# Patient Record
Sex: Male | Born: 1985 | Race: White | Hispanic: No | Marital: Single | Smoking: Current some day smoker
Health system: Southern US, Community
[De-identification: ages and names within clinical notes are randomized; demographics above are authoritative.]

## PROBLEM LIST (undated history)

## (undated) DIAGNOSIS — F419 Anxiety disorder, unspecified: Secondary | ICD-10-CM

---
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
## 2007-06-02 ENCOUNTER — Ambulatory Visit: Payer: Self-pay | Admitting: Family Medicine

## 2008-02-08 IMAGING — CR DG CHEST 2V
2 series · 2 of 2 positions shown · non-contrast
Comparison: None

CLINICAL DATA: Difficulty breathing

CHEST - 2 VIEW:

[view not recorded (1 of 2)]
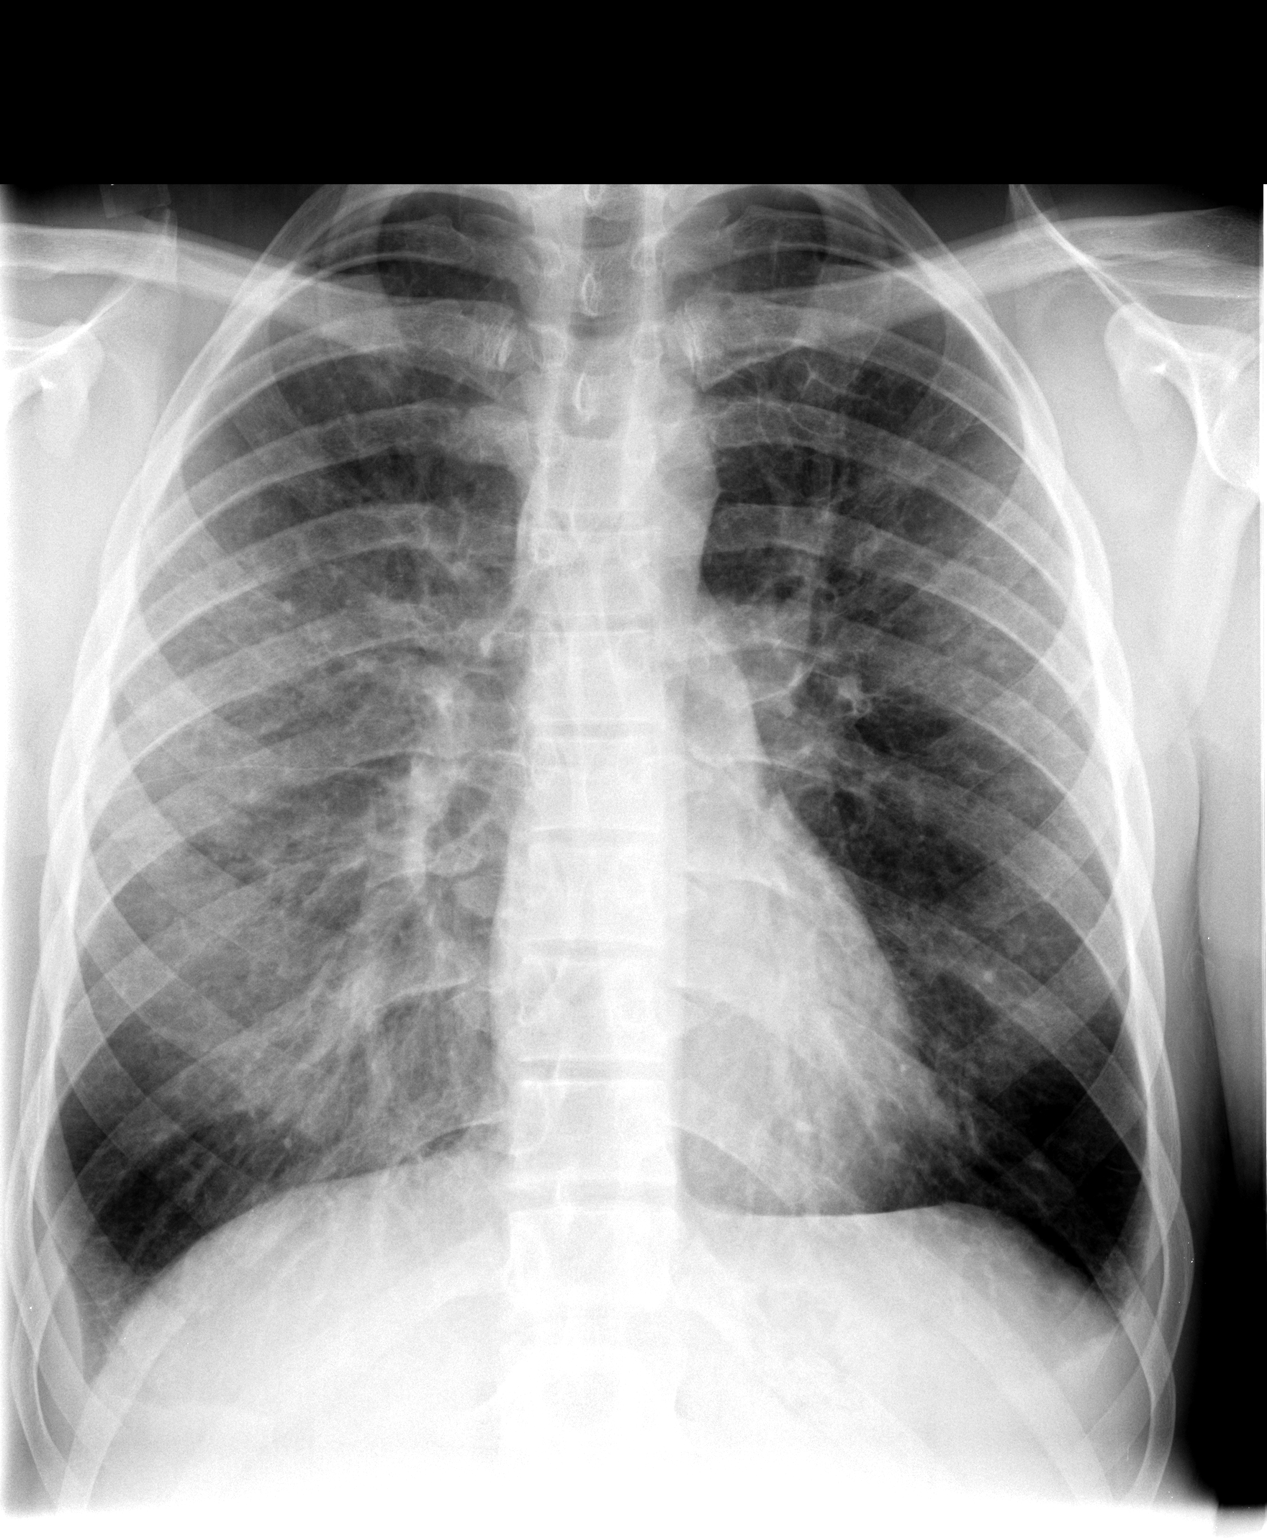

[view not recorded (2 of 2)]
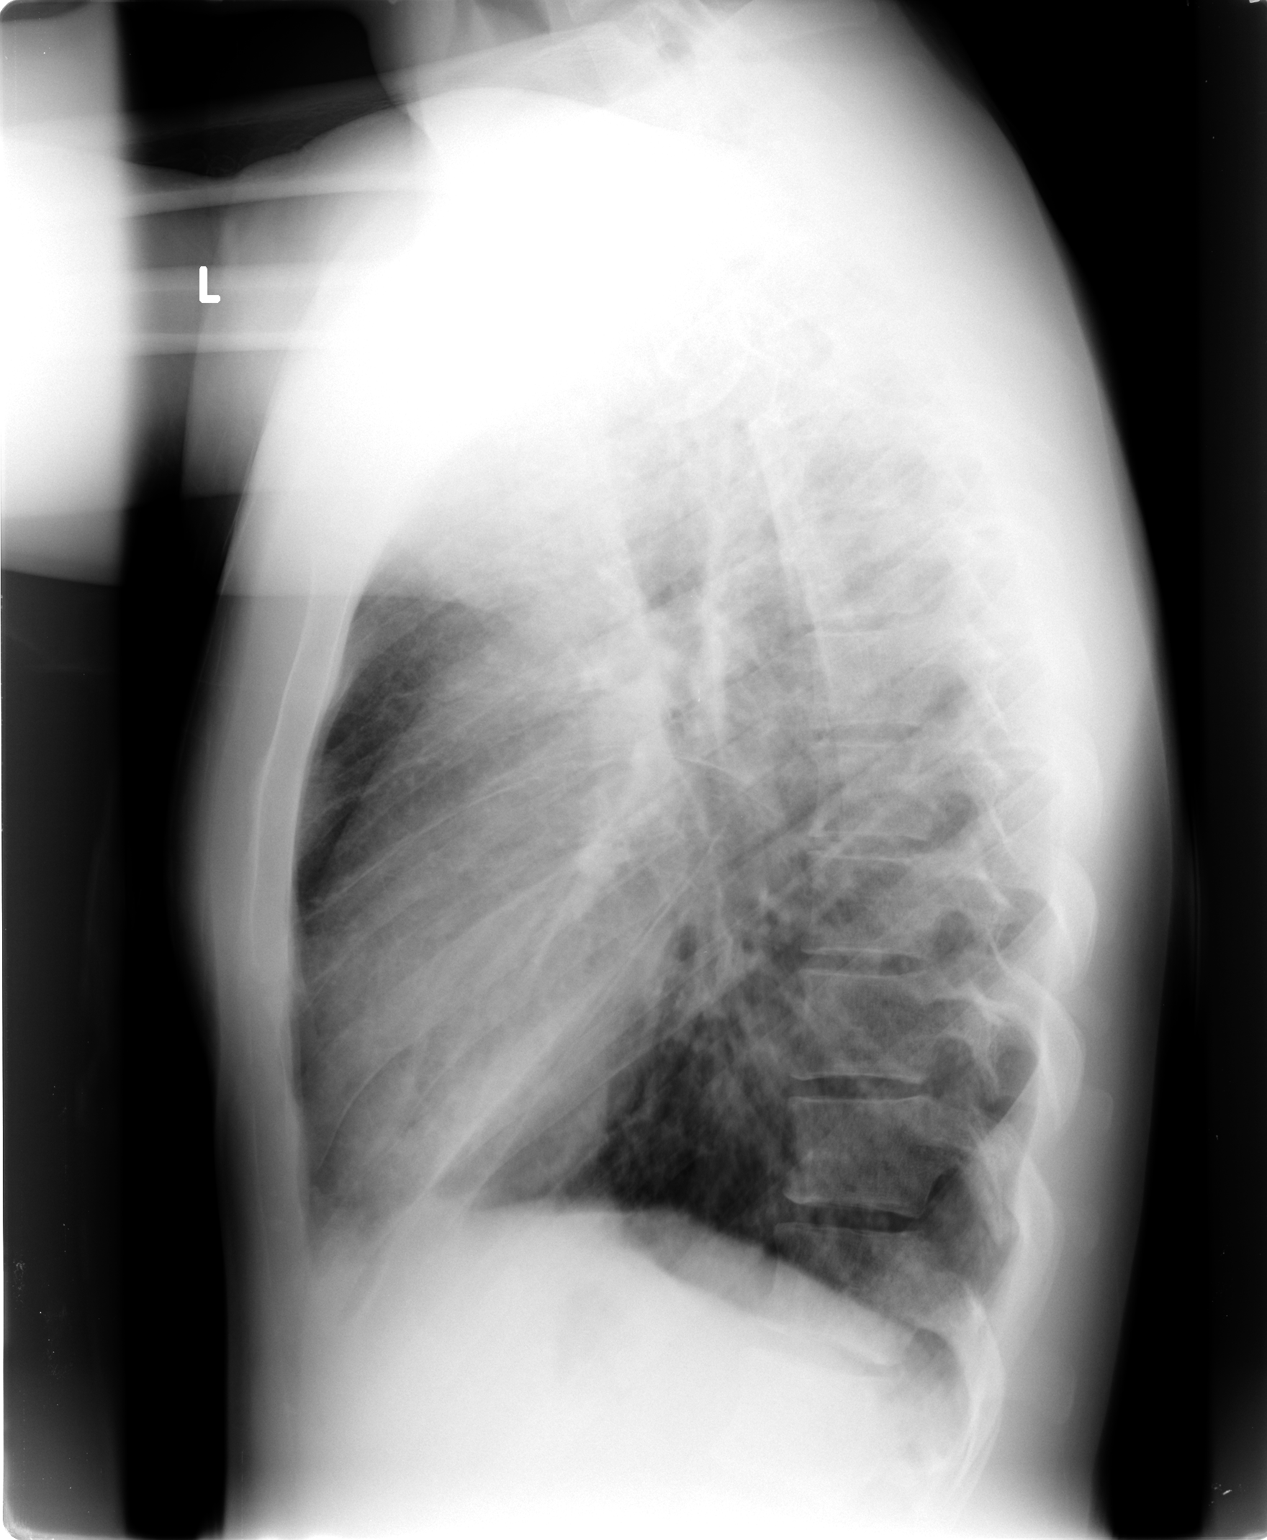

[2 of 2 positions shown; findings below may reference images not displayed]

FINDINGS: Heart and mediastinal contours are within normal limits. There is
peribronchial thickening and hyperinflation of the lungs. No focal opacities.
Small bilateral effusions noted. Visualized skeleton unremarkable.
IMPRESSION: Mild bronchitic changes with hyperinflation. Small bilateral effusions.

## 2010-04-28 ENCOUNTER — Emergency Department (HOSPITAL_COMMUNITY)
Admission: EM | Admit: 2010-04-28 | Discharge: 2010-04-28 | Disposition: A | Discharge status: Home / Self Care | Payer: BLUE CROSS/BLUE SHIELD | Attending: Emergency Medicine | Admitting: Emergency Medicine

## 2010-04-28 DIAGNOSIS — T50901A Poisoning by unspecified drugs, medicaments and biological substances, accidental (unintentional), initial encounter: Secondary | ICD-10-CM | POA: Insufficient documentation

## 2010-04-28 DIAGNOSIS — Y9229 Other specified public building as the place of occurrence of the external cause: Secondary | ICD-10-CM | POA: Insufficient documentation

## 2010-04-28 DIAGNOSIS — R000 Tachycardia, unspecified: Secondary | ICD-10-CM | POA: Insufficient documentation

## 2010-04-28 LAB — RAPID URINE DRUG SCREEN, HOSP PERFORMED
Amphetamines: NOT DETECTED
Barbiturates: NOT DETECTED
Benzodiazepines: NOT DETECTED
Cocaine: NOT DETECTED
Opiates: NOT DETECTED

## 2010-09-24 ENCOUNTER — Emergency Department (HOSPITAL_COMMUNITY)
Admission: EM | Admit: 2010-09-24 | Discharge: 2010-09-24 | Discharge status: Against Medical Advice | Payer: PRIVATE HEALTH INSURANCE | Attending: Emergency Medicine | Admitting: Emergency Medicine

## 2010-09-24 DIAGNOSIS — R209 Unspecified disturbances of skin sensation: Secondary | ICD-10-CM | POA: Insufficient documentation

## 2021-06-26 ENCOUNTER — Emergency Department (HOSPITAL_COMMUNITY): Payer: PRIVATE HEALTH INSURANCE

## 2021-06-26 ENCOUNTER — Emergency Department (HOSPITAL_COMMUNITY)
Admission: EM | Admit: 2021-06-26 | Discharge: 2021-06-26 | Disposition: A | Discharge status: Home / Self Care | Payer: PRIVATE HEALTH INSURANCE | Attending: Emergency Medicine | Admitting: Emergency Medicine

## 2021-06-26 ENCOUNTER — Other Ambulatory Visit: Payer: Self-pay

## 2021-06-26 ENCOUNTER — Encounter (HOSPITAL_COMMUNITY): Payer: Self-pay | Admitting: Emergency Medicine

## 2021-06-26 DIAGNOSIS — R0789 Other chest pain: Secondary | ICD-10-CM | POA: Insufficient documentation

## 2021-06-26 DIAGNOSIS — R0602 Shortness of breath: Secondary | ICD-10-CM | POA: Insufficient documentation

## 2021-06-26 HISTORY — DX: Anxiety disorder, unspecified: F41.9

## 2021-06-26 LAB — URINALYSIS, ROUTINE W REFLEX MICROSCOPIC
Bilirubin Urine: NEGATIVE
Glucose, UA: NEGATIVE mg/dL
Hgb urine dipstick: NEGATIVE
Ketones, ur: NEGATIVE mg/dL
Leukocytes,Ua: NEGATIVE
Nitrite: NEGATIVE
Protein, ur: NEGATIVE mg/dL
Specific Gravity, Urine: 1.023 (ref 1.005–1.030)
pH: 5 (ref 5.0–8.0)

## 2021-06-26 LAB — CBC
HCT: 46.3 % (ref 39.0–52.0)
Hemoglobin: 16.3 g/dL (ref 13.0–17.0)
MCH: 31.4 pg (ref 26.0–34.0)
MCHC: 35.2 g/dL (ref 30.0–36.0)
MCV: 89.2 fL (ref 80.0–100.0)
Platelets: 253 10*3/uL (ref 150–400)
RBC: 5.19 MIL/uL (ref 4.22–5.81)
RDW: 11.9 % (ref 11.5–15.5)
WBC: 14.7 10*3/uL — ABNORMAL HIGH (ref 4.0–10.5)
nRBC: 0 % (ref 0.0–0.2)

## 2021-06-26 LAB — COMPREHENSIVE METABOLIC PANEL
ALT: 31 U/L (ref 0–44)
AST: 25 U/L (ref 15–41)
Albumin: 3.9 g/dL (ref 3.5–5.0)
Alkaline Phosphatase: 58 U/L (ref 38–126)
Anion gap: 5 (ref 5–15)
BUN: 12 mg/dL (ref 6–20)
CO2: 23 mmol/L (ref 22–32)
Calcium: 8.8 mg/dL — ABNORMAL LOW (ref 8.9–10.3)
Chloride: 108 mmol/L (ref 98–111)
Creatinine, Ser: 1.03 mg/dL (ref 0.61–1.24)
GFR, Estimated: >60 mL/min (ref >60)
Glucose, Bld: 104 mg/dL — ABNORMAL HIGH (ref 70–99)
Potassium: 3.8 mmol/L (ref 3.5–5.1)
Sodium: 136 mmol/L (ref 135–145)
Total Bilirubin: 0.7 mg/dL (ref 0.3–1.2)
Total Protein: 6.5 g/dL (ref 6.5–8.1)

## 2021-06-26 LAB — TROPONIN I (HIGH SENSITIVITY): Troponin I (High Sensitivity): 3 ng/L (ref <18)

## 2021-06-26 LAB — LIPASE, BLOOD: Lipase: 34 U/L (ref 11–51)

## 2021-06-26 MED ORDER — ALUM & MAG HYDROXIDE-SIMETH 200-200-20 MG/5ML PO SUSP
30.0000 mL | Freq: Once | ORAL | Status: AC
  Administered 2021-06-26: 30 mL via ORAL
  Filled 2021-06-26: qty 30

## 2021-06-26 MED ORDER — ALBUTEROL SULFATE HFA 108 (90 BASE) MCG/ACT IN AERS: 2.0000 | INHALATION_SPRAY | RESPIRATORY_TRACT | Status: DC | PRN

## 2021-06-26 MED ORDER — LIDOCAINE VISCOUS HCL 2 % MT SOLN
15.0000 mL | Freq: Once | OROMUCOSAL | Status: AC
  Administered 2021-06-26: 15 mL via ORAL
  Filled 2021-06-26: qty 15

## 2021-06-26 MED ORDER — PANTOPRAZOLE SODIUM 20 MG PO TBEC: 20.0000 mg | DELAYED_RELEASE_TABLET | Freq: Every day | ORAL | 0 refills | Status: AC

## 2021-06-26 NOTE — ED Notes (Signed)
ED Provider at bedside. 

## 2021-06-26 NOTE — ED Triage Notes (Signed)
Pt reported to ED with c/o shortness of breath thathas been ongoing and occurring intermittently after coming home from work since starting new job that pt describes as "physical". Pt also states that onset of ShOB began this evening accompanied by epigastric pain that he describes as "burning". Pt endorses some nausea. Also reports a hx of anxiety, states he previously took Lexapro and feels that he may need to be put back on it due to recent life stressors. Pt denies any SI/HI at this time.

## 2021-06-26 NOTE — Discharge Instructions (Addendum)
At this time there does not appear to be the presence of an emergent medical condition, however there is always the potential for conditions to change. Please read and follow the below instructions.  Please return to the Emergency Department immediately for any new or worsening symptoms. Please be sure to follow up with your Primary Care Provider within one week regarding your visit today; please call their office to schedule an appointment even if you are feeling better for a follow-up visit. You may take the Protonix as prescribed to help with possible acid reflux. Please call the cardiologist on your discharge paperwork to schedule a follow-up appointment to discuss risk factors for heart disease and to discuss further testing. Please stop smoking immediately.  Go to the nearest Emergency Department immediately if: You have fever or chills Your chest pain is worse. You have a cough that gets worse, or you cough up blood. You have very bad (severe) pain in your belly (abdomen). You pass out (faint). You have either of these for no clear reason: Sudden chest discomfort. Sudden discomfort in your arms, back, neck, or jaw. You have shortness of breath at any time. You suddenly start to sweat, or your skin gets clammy. You feel sick to your stomach (nauseous). You throw up (vomit). You suddenly feel lightheaded or dizzy. You feel very weak or tired. Your heart starts to beat fast, or it feels like it is skipping beats. You have any new/concerning or worsening of symptoms.   Please read the additional information packets attached to your discharge summary.  Do not take your medicine if  develop an itchy rash, swelling in your mouth or lips, or difficulty breathing; call 911 and seek immediate emergency medical attention if this occurs.  You may review your lab tests and imaging results in their entirety on your MyChart account.  Please discuss all results of fully with your primary care  provider and other specialist at your follow-up visit.  Note: Portions of this text may have been transcribed using voice recognition software. Every effort was made to ensure accuracy; however, inadvertent computerized transcription errors may still be present.

## 2021-06-26 NOTE — ED Provider Notes (Signed)
[facility] EMERGENCY DEPARTMENT Provider Note   CSN: [id number] Arrival date & time: 06/26/21  0419     History  Chief Complaint  Patient presents with   Shortness of Breath   Abdominal Pain    Bill Rasmussen is a 36 y.o. male presented to the ER for evaluation of shortness of breath chest pain.  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  Patient denies history of SI/HI, reports that he was treated for anxiety in the past currently not on any medications and is debating on speaking with a counselor again about this.  Patient reported that the symptoms were manageable however last night while working he developed some chest pain, he indicates his lower chest/epigastrium, he describes pain as a burning moderate intensity pain that does not radiate is associated with nausea and has been constant since onset, he reports onset was around 11 PM last night.  He denies similar symptoms in the past.  Of note patient reports he is a current every day smoker.  He also reports that his father had a heart attack at age 71.  Patient denies fall/injury, fever, chills, hemoptysis, extremity swelling, history of DVT, recent surgery/immobilization, hormone use or any additional concerns today. HPI     Home Medications Prior to Admission medications   Medication Sig Start Date End Date Taking? Authorizing Provider  pantoprazole (PROTONIX) 20 MG tablet Take 1 tablet (20 mg total) by mouth daily. 06/26/21 07/26/21 Yes Nuala Alpha A, PA-C      Allergies    Patient has no known allergies.    Review of Systems   Review of Systems Ten systems are reviewed and are negative for acute change except as noted in the HPI   Physical Exam Updated Vital Signs BP 126/82 (BP Location:  Right Arm)   Pulse 84   Temp 97.7 F (36.5 C) (Oral)   Resp 18   Ht 6' (1.829 m)   Wt 108.9 kg   SpO2 99%   BMI 32.55 kg/m  Physical Exam Constitutional:      General: He is not in acute distress.    Appearance: Normal appearance. He is well-developed. He is not ill-appearing or diaphoretic.  HENT:     Head: Normocephalic and atraumatic.  Eyes:     General: Vision grossly intact. Gaze aligned appropriately.     Pupils: Pupils are equal, round, and reactive to light.  Neck:     Trachea: Trachea and phonation normal.  Cardiovascular:     Rate and Rhythm: Normal rate and regular rhythm.  Pulmonary:     Effort: Pulmonary effort is normal. No respiratory distress.     Breath sounds: Normal breath sounds.  Abdominal:     General: There is no distension.     Palpations: Abdomen is soft.     Tenderness: There is no abdominal tenderness. There is no guarding or rebound.  Musculoskeletal:        General: Normal range of motion.     Cervical back: Normal range of motion.     Right lower leg: No edema.     Left lower leg: No edema.  Skin:    General: Skin is warm and dry.  Neurological:     Mental Status: He is alert.  GCS: GCS eye subscore is 4. GCS verbal subscore is 5. GCS motor subscore is 6.     Comments: Speech is clear and goal oriented, follows commands Major Cranial nerves without deficit, no facial droop Moves extremities without ataxia, coordination intact  Psychiatric:        Attention and Perception: Attention normal.        Speech: Speech normal.        Behavior: Behavior normal. Behavior is cooperative.        Thought Content: Thought content does not include homicidal or suicidal ideation.     ED Results / Procedures / Treatments   Labs (all labs ordered are listed, but only abnormal results are displayed) Labs Reviewed  COMPREHENSIVE METABOLIC PANEL - Abnormal; Notable for the following components:      Result Value   Glucose, Bld 104 (*)    Calcium  8.8 (*)    All other components within normal limits  CBC - Abnormal; Notable for the following components:   WBC 14.7 (*)    All other components within normal limits  LIPASE, BLOOD  URINALYSIS, ROUTINE W REFLEX MICROSCOPIC  TROPONIN I (HIGH SENSITIVITY)    EKG EKG Interpretation  Date/Time:  Friday June 26 2021 09:10:01 EDT Ventricular Rate:  78 PR Interval:  150 QRS Duration: 90 QT Interval:  350 QTC Calculation: 399 R Axis:   40 Text Interpretation: Normal sinus rhythm Normal ECG When compared with ECG of 26-Jun-2021 04:27, PREVIOUS ECG IS PRESENT similar to earlier today Confirmed by Ezequiel Essex [phone]) on 06/26/2021 9:27:16 AM  Radiology DG Chest 2 View  Result Date: 06/26/2021 CLINICAL DATA:  Shortness of breath.  Cough, nausea and anxiety. EXAM: CHEST - 2 VIEW COMPARISON:  07/05/2011 FINDINGS: The heart size and mediastinal contours are within normal limits. Both lungs are clear. The visualized skeletal structures are unremarkable. IMPRESSION: No active cardiopulmonary disease. Electronically Signed   By: Kerby Moors M.D.   On: 06/26/2021 05:37    Procedures Procedures    Medications Ordered in ED Medications  albuterol (VENTOLIN HFA) 108 (90 Base) MCG/ACT inhaler 2 puff (has no administration in time range)  alum & mag hydroxide-simeth (MAALOX/MYLANTA) 200-200-20 MG/5ML suspension 30 mL (30 mLs Oral Given 06/26/21 0736)    And  lidocaine (XYLOCAINE) 2 % viscous mouth solution 15 mL (15 mLs Oral Given 06/26/21 0736)    ED Course/ Medical Decision Making/ A&P Clinical Course as of 06/26/21 0943  Fri Jun 26, 2021  0651 ED EKG No acute ischemic changes on my interpretation. [BM]  0652 DG Chest 2 View No obvious ptx, pna, pleual effusion noted [BM]  0653 Comprehensive metabolic panel(!) No emergent electrolyte derangement, aki, lft elevations or gap. [BM]  0653 Lipase, blood Normal lipase [BM]  0653 CBC(!) Mild leukocytosis of 14.7. No anemia or  thrombocytopenia. [BM]  [phone] Urinalysis, Routine w reflex microscopic Urine, Clean Catch Wnl. [BM]  0849 Troponin I (High Sensitivity) WNL; onset >8hrs; no indication for delta troponin. [BM]    Clinical Course User Index [BM] Deliah Boston, PA-C                           Medical Decision Making 36 year old male everyday smoker no current medical diagnoses presented to the ER for shortness of breath which has been intermittent for the past several months he attributes this to the stress and anxiety at his job however over the past 8 hours patient has also  been experiencing lower chest/epigastric pain which is continuing.  Patient reports that his father had a heart attack at age 73.  Abdominal pain labs along with chest x-ray and EKG were obtained in triage overall reassuring.  Given patient's history of smoking and family history of heart disease at young age feel a troponin is indicated to assess for possible ACS given patient's risk factors.  Amount and/or Complexity of Data Reviewed Labs: ordered. Decision-making details documented in ED Course. Radiology: ordered. Decision-making details documented in ED Course. ECG/medicine tests: ordered. Decision-making details documented in ED Course.  Risk OTC drugs. Prescription drug management. Risk Details: Patient's work-up today is overall reassuring.  High-sensitivity troponin within normal limits greater than 8 hours after onset of chest pain, no indication for delta troponin.  Overall low suspicion for ACS at this point.  Patient's heart score is less than 4 no indication for observation admission.  Patient was counseled on smoking cessation, he was also referred outpatient to cardiology given family history of heart disease under the age of 23.  Patient low risk by Wells criteria, PERC negative, low suspicion for PE as cause of patient's pain today.  Additionally low suspicion for dissection, PTX, PNA, anemia or other emergent causes of  chest pain.  Suspect patient is likely experiencing GERD at this point he was prescribed Protonix to help with this.  I asked the patient's stress/anxiety due to his job he plans to follow-up with his counselor he was also made aware of the behavioral health urgent care here in [place] that he can follow-up with as well he has no SI/HI, there is no indication for psychiatric consultation or admission at this point.  Finally patient without abdominal pain today, reassuring abdominal examination low suspicion for pancreatitis, cholecystitis, perforation or other emergent intra-abdominal cause of symptoms. --- At time of discharge repeat x-ray was obtained, shows no acute ischemic changes, reviewed by Dr. Wyvonnia Dusky.  Patient reports that symptoms have resolved following GI cocktail earlier today.  We discussed close follow-up and smoking cessation today.    At this time there does not appear to be any evidence of an acute emergency medical condition and the patient appears stable for discharge with appropriate outpatient follow up. Diagnosis was discussed with patient who verbalizes understanding of care plan and is agreeable to discharge. I have discussed return precautions with patient who verbalizes understanding. Patient encouraged to follow-up with their PCP, counselor and cardiology. All questions answered.  Patient seen and evaluated by Dr. Kingsley Callander today who agrees with plan of discharge.    Note: Portions of this report may have been transcribed using voice recognition software. Every effort was made to ensure accuracy; however, inadvertent computerized transcription errors may still be present.         Final Clinical Impression(s) / ED Diagnoses Final diagnoses:  Atypical chest pain    Rx / DC Orders ED Discharge Orders          Ordered    pantoprazole (PROTONIX) 20 MG tablet  Daily        06/26/21 0933              Deliah Boston, PA-C 06/26/21 0944     Ezequiel Essex, MD 06/27/21 [phone]

## 2021-06-27 ENCOUNTER — Telehealth (HOSPITAL_COMMUNITY): Payer: Self-pay | Admitting: Emergency Medicine

## 2021-06-27 DIAGNOSIS — R0789 Other chest pain: Secondary | ICD-10-CM

## 2021-06-27 NOTE — Telephone Encounter (Signed)
Cardiology referral placed

## 2023-09-22 ENCOUNTER — Emergency Department (HOSPITAL_COMMUNITY)
Admission: EM | Admit: 2023-09-22 | Discharge: 2023-09-22 | Disposition: A | Discharge status: Home / Self Care | Attending: Emergency Medicine | Admitting: Emergency Medicine

## 2023-09-22 ENCOUNTER — Encounter (HOSPITAL_COMMUNITY): Payer: Self-pay

## 2023-09-22 ENCOUNTER — Other Ambulatory Visit: Payer: Self-pay

## 2023-09-22 DIAGNOSIS — F17210 Nicotine dependence, cigarettes, uncomplicated: Secondary | ICD-10-CM | POA: Diagnosis not present

## 2023-09-22 DIAGNOSIS — F419 Anxiety disorder, unspecified: Secondary | ICD-10-CM | POA: Diagnosis present

## 2023-09-22 DIAGNOSIS — T43221A Poisoning by selective serotonin reuptake inhibitors, accidental (unintentional), initial encounter: Secondary | ICD-10-CM | POA: Diagnosis not present

## 2023-09-22 DIAGNOSIS — T50901A Poisoning by unspecified drugs, medicaments and biological substances, accidental (unintentional), initial encounter: Secondary | ICD-10-CM

## 2023-09-22 NOTE — Discharge Instructions (Addendum)
 We evaluated you for taking an extra dose of your sertraline (Zoloft).  We do not think that you took an amount that would be dangerous.  We believe that it is safe to go home at this time.  The symptoms that you have experienced are typical for this medication when taking higher dose when initiating medication therapy.  Please purchase a pill organizer to help make sure that you are taking the correct dose of your medication.  Please return if you develop any new or worsening symptoms such as fainting, difficulty breathing, chest pain, or any thoughts of self-harm or harming others.  Please follow-up with your primary doctor and psychiatrist.  If you do not have a psychiatrist, we have attached some resources.

## 2023-09-22 NOTE — ED Provider Notes (Signed)
 [facility] EMERGENCY DEPARTMENT AT [facility] Provider Note  CSN: [id number] Arrival date & time: 09/22/23 9180  Chief Complaint(s) Anxiety  HPI Bill Rasmussen is a 38 y.o. male history of anxiety presenting to the emergency department with feeling jittery and anxious.  He reports that he just started taking sertraline around a week ago.  Was previously on Lexapro distantly but has been more anxious so was started back on sertraline.  Reports that he has been taking 50 mg without issue, this morning accidentally took an extra 50 mg tablet because he forgot he took the morning dose, took the morning dose when he was still sleepy.  Reports that he felt jittery and anxious, no lightheadedness or dizziness, chest pain, shortness of breath, palpitations, syncope.  Took a dose of hydroxyzine for his anxiety but did not take any other medications.  Denies any intentional self-harm or suicidal ideation, homicidal ideation.  He reports after taking hydroxyzine he feels much better but just wanted to get checked out because he looked it up on [employer].   Past Medical History Past Medical History:  Diagnosis Date   Anxiety    There are no active problems to display for this patient.  Home Medication(s) Prior to Admission medications   Medication Sig Start Date End Date Taking? Authorizing Provider  pantoprazole  (PROTONIX ) 20 MG tablet Take 1 tablet (20 mg total) by mouth daily. 06/26/21 07/26/21  Donah Penne DELENA DEVONNA                                                                                                                                    Past Surgical History History reviewed. No pertinent surgical history. Family History History reviewed. No pertinent family history.  Social History Social History   Tobacco Use   Smoking status: Some Days    Types: Cigarettes   Smokeless tobacco: Never  Substance Use Topics   Alcohol use: Not Currently   Drug use: Never    Allergies Patient has no known allergies.  Review of Systems Review of Systems  All other systems reviewed and are negative.   Physical Exam Vital Signs  I have reviewed the triage vital signs BP (!) 134/94 (BP Location: Right Arm)   Pulse 73   Temp 98 F (36.7 C) (Oral)   Resp 16   SpO2 100%  Physical Exam Vitals and nursing note reviewed.  Constitutional:      General: He is not in acute distress.    Appearance: Normal appearance.  HENT:     Mouth/Throat:     Mouth: Mucous membranes are moist.  Eyes:     Conjunctiva/sclera: Conjunctivae normal.  Cardiovascular:     Rate and Rhythm: Normal rate and regular rhythm.  Pulmonary:     Effort: Pulmonary effort is normal. No respiratory distress.     Breath sounds: Normal breath sounds.  Abdominal:     General: Abdomen  is flat.     Palpations: Abdomen is soft.     Tenderness: There is no abdominal tenderness.  Musculoskeletal:     Right lower leg: No edema.     Left lower leg: No edema.  Skin:    General: Skin is warm and dry.     Capillary Refill: Capillary refill takes less than 2 seconds.  Neurological:     Mental Status: He is alert and oriented to person, place, and time. Mental status is at baseline.  Psychiatric:        Mood and Affect: Mood is anxious.        Behavior: Behavior normal. Behavior is cooperative.        Thought Content: Thought content does not include homicidal or suicidal ideation.     ED Results and Treatments Labs (all labs ordered are listed, but only abnormal results are displayed) Labs Reviewed - No data to display                                                                                                                        Radiology No results found.  Pertinent labs & imaging results that were available during my care of the patient were reviewed by me and considered in my medical decision making (see MDM for details).  Medications Ordered in ED Medications - No data to  display                                                                                                                                   Procedures Procedures  (including critical care time)  Medical Decision Making / ED Course   MDM:  38 year old presenting to the emergency department with accidental medication ingestion.  Patient reports he took an extra tablet of sertraline, taking a total of 100 mg.  This is not a dangerous level of ingestion and is a typical dose for many people.  This could definitely cause some extra anxiety since it is also a new medication.  He is also feeling better after taking hydroxyzine and is feeling back to baseline.  Denies any other accidental ingestion. his examination is normal and he denies any intentional self-harm or suicidal ideation.  I do not think he needs any further medical evaluation in the emergency department.  Discussed with the patient, he is feeling better and he is comfortable with this plan.  He is buying  a pill organizer on [employer].  Discussed return precautions and provided additional psychiatric resources.  Will discharge patient to home. All questions answered. Patient comfortable with plan of discharge. Return precautions discussed with patient and specified on the after visit summary.       Additional history obtained:  -External records from outside source obtained and reviewed including: Chart review including previous notes, labs, imaging, consultation notes including recent telemedicine visit for anxiety     Medicines ordered and prescription drug management: No orders of the defined types were placed in this encounter.   -I have reviewed the patients home medicines and have made adjustments as needed  Co morbidities that complicate the patient evaluation  Past Medical History:  Diagnosis Date   Anxiety       Dispostion: Disposition decision including need for hospitalization was considered, and patient  discharged from emergency department.    Final Clinical Impression(s) / ED Diagnoses Final diagnoses:  Accidental drug ingestion, initial encounter     This chart was dictated using voice recognition software.  Despite best efforts to proofread,  errors can occur which can change the documentation meaning.    Francesca Elsie CROME, MD 09/22/23 [phone]

## 2023-09-22 NOTE — ED Triage Notes (Signed)
 Pt states he just started taking Zoloft 50mg  on the 27th and he accidentally took 2 doses today and now he feels really anxious and nervous about it and came to get checked out. He just took 25mg  of his hydroxyzine to help with his anxiety about it. Denies any chest pain or sob just feeling really anxious.
